# Patient Record
Sex: Female | Born: 1960 | Race: White | Hispanic: No | Marital: Married | Smoking: Never smoker
Health system: Southern US, Community
[De-identification: ages and names within clinical notes are randomized; demographics above are authoritative.]

## PROBLEM LIST (undated history)

## (undated) DIAGNOSIS — T7840XA Allergy, unspecified, initial encounter: Secondary | ICD-10-CM

## (undated) HISTORY — PX: BREAST EXCISIONAL BIOPSY: SUR124

## (undated) HISTORY — PX: COLONOSCOPY: SHX174

## (undated) HISTORY — PX: BREAST BIOPSY: SHX20

## (undated) HISTORY — DX: Allergy, unspecified, initial encounter: T78.40XA

---

## 2004-11-05 ENCOUNTER — Encounter: Admission: RE | Admit: 2004-11-05 | Discharge: 2004-11-05 | Payer: Self-pay | Admitting: Obstetrics and Gynecology

## 2005-11-07 ENCOUNTER — Encounter: Admission: RE | Admit: 2005-11-07 | Discharge: 2005-11-07 | Payer: Self-pay | Admitting: Obstetrics and Gynecology

## 2006-01-23 ENCOUNTER — Encounter (INDEPENDENT_AMBULATORY_CARE_PROVIDER_SITE_OTHER): Payer: Self-pay | Admitting: Specialist

## 2006-01-23 ENCOUNTER — Ambulatory Visit (HOSPITAL_COMMUNITY): Admission: RE | Admit: 2006-01-23 | Discharge: 2006-01-23 | Payer: Self-pay | Admitting: Gastroenterology

## 2006-11-09 ENCOUNTER — Encounter: Admission: RE | Admit: 2006-11-09 | Discharge: 2006-11-09 | Payer: Self-pay | Admitting: Obstetrics and Gynecology

## 2007-09-26 ENCOUNTER — Emergency Department (HOSPITAL_COMMUNITY): Admission: EM | Admit: 2007-09-26 | Discharge: 2007-09-26 | Payer: Self-pay | Admitting: Emergency Medicine

## 2007-11-12 ENCOUNTER — Encounter: Admission: RE | Admit: 2007-11-12 | Discharge: 2007-11-12 | Payer: Self-pay | Admitting: Obstetrics and Gynecology

## 2008-11-13 ENCOUNTER — Encounter: Admission: RE | Admit: 2008-11-13 | Discharge: 2008-11-13 | Payer: Self-pay | Admitting: Obstetrics and Gynecology

## 2009-11-16 ENCOUNTER — Encounter: Admission: RE | Admit: 2009-11-16 | Discharge: 2009-11-16 | Payer: Self-pay | Admitting: Obstetrics and Gynecology

## 2010-03-22 ENCOUNTER — Encounter: Payer: Self-pay | Admitting: Obstetrics and Gynecology

## 2010-07-16 NOTE — Op Note (Signed)
Ana, [name] NO.:  [id number]   MEDICAL RECORD NO.:  [id number]          PATIENT TYPE:  AMB   LOCATION:  ENDO                         FACILITY:  [facility]   PHYSICIAN:  Anselmo Rod, M.D.  DATE OF BIRTH:  [birth year]   DATE OF PROCEDURE:  01/23/2006  DATE OF DISCHARGE:                               OPERATIVE REPORT   PROCEDURE PERFORMED:  Esophagogastroduodenoscopy with antral biopsies.   ENDOSCOPIST:  Anselmo Rod, M.D.   INSTRUMENT USED:  Olympus video panendoscope.   INDICATIONS FOR PROCEDURE:  50 year old white female with a history of  chest pain and pressure undergoing EGD to rule out esophagitis,  gastritis, etc.   PREPROCEDURE PREPARATION:  Informed consent was procured from the  patient.  The patient was fasted for 8 hours prior to procedure. Risks  and benefits were discussed within great detail.   PREPROCEDURE PHYSICAL:  Patient had stable vital signs.  NECK:  Supple.  CHEST:  Clear to auscultation.  S1 and S2 regular.  ABDOMEN:  Soft with  normal bowel sounds.   DESCRIPTION OF PROCEDURE:  The patient was placed in left lateral  decubitus position and sedated with 75 mcg of fentanyl and 8 mg of  Versed given intravenously in slow incremental doses.  Once the patient  was adequately sedated and maintained on low-flow oxygen and continuous  cardiac monitoring the Olympus video panendoscope was advanced through  the mouthpiece over the tongue into the esophagus under direct vision.  The proximal esophagus appeared normal, grade 2 distal esophagitis was  noted. On advancing the scope into the stomach diffuse gastritis was  noted.  Antral biopsies were done to rule out presence of H pylori by  pathology.  Retroflexion in the high cardia revealed no abnormalities,  the proximal small bowel appeared normal.  There was no outlet  obstruction. The patient tolerated the procedure well without immediate  complications.   IMPRESSION:  1. Grade  2 distal esophagitis.  2. Diffuse gastritis, biopsies done for H pylori.  3. Normal proximal small bowel.   RECOMMENDATIONS:  1. Continue PPI.  2. Avoid nonsteroidals.  3. Carafate slurry 1 gram q.i.d. between meals and bedtime.  4. Treat with antibiotics if H pylori present on biopsies.  5. Outpatient follow-up in the next 2 weeks for further      recommendations.      Anselmo Rod, M.D.  Electronically Signed     JNM/MEDQ  D:  01/23/2006  T:  01/23/2006  Job:  [id number]   cc:   Talmadge Coventry, M.D.  Sherry A. Rosalio Macadamia, M.D.

## 2010-10-13 ENCOUNTER — Other Ambulatory Visit: Payer: Self-pay | Admitting: Obstetrics

## 2010-10-13 DIAGNOSIS — Z1231 Encounter for screening mammogram for malignant neoplasm of breast: Secondary | ICD-10-CM

## 2010-11-22 ENCOUNTER — Ambulatory Visit
Admission: RE | Admit: 2010-11-22 | Discharge: 2010-11-22 | Disposition: A | Discharge status: Home / Self Care | Payer: Managed Care, Other (non HMO) | Source: Ambulatory Visit | Attending: Obstetrics | Admitting: Obstetrics

## 2010-11-22 DIAGNOSIS — Z1231 Encounter for screening mammogram for malignant neoplasm of breast: Secondary | ICD-10-CM

## 2011-03-30 ENCOUNTER — Other Ambulatory Visit: Payer: Self-pay | Admitting: Obstetrics

## 2011-03-30 DIAGNOSIS — Z780 Asymptomatic menopausal state: Secondary | ICD-10-CM

## 2011-03-30 DIAGNOSIS — M8580 Other specified disorders of bone density and structure, unspecified site: Secondary | ICD-10-CM

## 2011-04-04 ENCOUNTER — Ambulatory Visit
Admission: RE | Admit: 2011-04-04 | Discharge: 2011-04-04 | Disposition: A | Discharge status: Home / Self Care | Payer: Managed Care, Other (non HMO) | Source: Ambulatory Visit | Attending: Obstetrics | Admitting: Obstetrics

## 2011-04-04 DIAGNOSIS — M8580 Other specified disorders of bone density and structure, unspecified site: Secondary | ICD-10-CM

## 2011-04-04 DIAGNOSIS — Z780 Asymptomatic menopausal state: Secondary | ICD-10-CM

## 2011-10-26 ENCOUNTER — Other Ambulatory Visit: Payer: Self-pay | Admitting: Obstetrics

## 2011-10-26 DIAGNOSIS — Z1231 Encounter for screening mammogram for malignant neoplasm of breast: Secondary | ICD-10-CM

## 2011-11-25 ENCOUNTER — Ambulatory Visit
Admission: RE | Admit: 2011-11-25 | Discharge: 2011-11-25 | Disposition: A | Discharge status: Home / Self Care | Payer: Managed Care, Other (non HMO) | Source: Ambulatory Visit | Attending: Obstetrics | Admitting: Obstetrics

## 2011-11-25 DIAGNOSIS — Z1231 Encounter for screening mammogram for malignant neoplasm of breast: Secondary | ICD-10-CM

## 2012-10-25 ENCOUNTER — Other Ambulatory Visit: Payer: Self-pay

## 2012-10-25 DIAGNOSIS — R922 Inconclusive mammogram: Secondary | ICD-10-CM

## 2012-10-25 DIAGNOSIS — R9230 Dense breasts, unspecified: Secondary | ICD-10-CM

## 2012-10-25 DIAGNOSIS — Z9889 Other specified postprocedural states: Secondary | ICD-10-CM

## 2012-10-25 DIAGNOSIS — Z1231 Encounter for screening mammogram for malignant neoplasm of breast: Secondary | ICD-10-CM

## 2012-10-26 ENCOUNTER — Other Ambulatory Visit: Payer: Self-pay | Admitting: Obstetrics

## 2012-10-26 DIAGNOSIS — R9230 Dense breasts, unspecified: Secondary | ICD-10-CM

## 2012-10-26 DIAGNOSIS — Z1231 Encounter for screening mammogram for malignant neoplasm of breast: Secondary | ICD-10-CM

## 2012-10-26 DIAGNOSIS — Z9889 Other specified postprocedural states: Secondary | ICD-10-CM

## 2012-10-26 DIAGNOSIS — R922 Inconclusive mammogram: Secondary | ICD-10-CM

## 2012-11-12 ENCOUNTER — Ambulatory Visit
Admission: RE | Admit: 2012-11-12 | Discharge: 2012-11-12 | Disposition: A | Discharge status: Home / Self Care | Payer: BC Managed Care – PPO | Source: Ambulatory Visit

## 2012-11-12 DIAGNOSIS — R9230 Dense breasts, unspecified: Secondary | ICD-10-CM

## 2012-11-12 DIAGNOSIS — Z1231 Encounter for screening mammogram for malignant neoplasm of breast: Secondary | ICD-10-CM

## 2012-11-12 DIAGNOSIS — R922 Inconclusive mammogram: Secondary | ICD-10-CM

## 2012-11-12 DIAGNOSIS — Z9889 Other specified postprocedural states: Secondary | ICD-10-CM

## 2013-10-08 ENCOUNTER — Other Ambulatory Visit: Payer: Self-pay

## 2013-10-08 DIAGNOSIS — Z1231 Encounter for screening mammogram for malignant neoplasm of breast: Secondary | ICD-10-CM

## 2013-11-13 ENCOUNTER — Ambulatory Visit
Admission: RE | Admit: 2013-11-13 | Discharge: 2013-11-13 | Disposition: A | Discharge status: Home / Self Care | Payer: BC Managed Care – PPO | Source: Ambulatory Visit

## 2013-11-13 DIAGNOSIS — Z1231 Encounter for screening mammogram for malignant neoplasm of breast: Secondary | ICD-10-CM

## 2014-09-29 ENCOUNTER — Other Ambulatory Visit: Payer: Self-pay

## 2014-09-29 DIAGNOSIS — Z1231 Encounter for screening mammogram for malignant neoplasm of breast: Secondary | ICD-10-CM

## 2014-11-17 ENCOUNTER — Ambulatory Visit
Admission: RE | Admit: 2014-11-17 | Discharge: 2014-11-17 | Disposition: A | Discharge status: Home / Self Care | Payer: BLUE CROSS/BLUE SHIELD | Source: Ambulatory Visit

## 2014-11-17 DIAGNOSIS — Z1231 Encounter for screening mammogram for malignant neoplasm of breast: Secondary | ICD-10-CM

## 2015-06-24 ENCOUNTER — Other Ambulatory Visit: Payer: Self-pay | Admitting: Obstetrics

## 2015-06-24 DIAGNOSIS — M8580 Other specified disorders of bone density and structure, unspecified site: Secondary | ICD-10-CM

## 2015-07-31 ENCOUNTER — Ambulatory Visit
Admission: RE | Admit: 2015-07-31 | Discharge: 2015-07-31 | Disposition: A | Discharge status: Home / Self Care | Payer: BLUE CROSS/BLUE SHIELD | Source: Ambulatory Visit | Attending: Obstetrics | Admitting: Obstetrics

## 2015-07-31 DIAGNOSIS — M8580 Other specified disorders of bone density and structure, unspecified site: Secondary | ICD-10-CM

## 2015-10-09 ENCOUNTER — Other Ambulatory Visit: Payer: Self-pay | Admitting: Obstetrics

## 2015-10-09 DIAGNOSIS — Z1231 Encounter for screening mammogram for malignant neoplasm of breast: Secondary | ICD-10-CM

## 2015-11-19 ENCOUNTER — Ambulatory Visit
Admission: RE | Admit: 2015-11-19 | Discharge: 2015-11-19 | Disposition: A | Discharge status: Home / Self Care | Payer: BLUE CROSS/BLUE SHIELD | Source: Ambulatory Visit | Attending: Obstetrics | Admitting: Obstetrics

## 2015-11-19 DIAGNOSIS — Z1231 Encounter for screening mammogram for malignant neoplasm of breast: Secondary | ICD-10-CM

## 2016-03-28 DIAGNOSIS — Z0000 Encounter for general adult medical examination without abnormal findings: Secondary | ICD-10-CM | POA: Diagnosis not present

## 2016-03-28 DIAGNOSIS — Z23 Encounter for immunization: Secondary | ICD-10-CM | POA: Diagnosis not present

## 2016-03-28 DIAGNOSIS — Z6820 Body mass index (BMI) 20.0-20.9, adult: Secondary | ICD-10-CM | POA: Diagnosis not present

## 2016-03-28 DIAGNOSIS — Z136 Encounter for screening for cardiovascular disorders: Secondary | ICD-10-CM | POA: Diagnosis not present

## 2016-05-26 DIAGNOSIS — Z23 Encounter for immunization: Secondary | ICD-10-CM | POA: Diagnosis not present

## 2016-06-27 DIAGNOSIS — Z01419 Encounter for gynecological examination (general) (routine) without abnormal findings: Secondary | ICD-10-CM | POA: Diagnosis not present

## 2016-06-27 DIAGNOSIS — Z681 Body mass index (BMI) 19 or less, adult: Secondary | ICD-10-CM | POA: Diagnosis not present

## 2016-09-26 DIAGNOSIS — Z23 Encounter for immunization: Secondary | ICD-10-CM | POA: Diagnosis not present

## 2016-10-11 ENCOUNTER — Other Ambulatory Visit: Payer: Self-pay | Admitting: Family Medicine

## 2016-10-11 DIAGNOSIS — Z1231 Encounter for screening mammogram for malignant neoplasm of breast: Secondary | ICD-10-CM

## 2016-11-21 ENCOUNTER — Ambulatory Visit
Admission: RE | Admit: 2016-11-21 | Discharge: 2016-11-21 | Disposition: A | Discharge status: Home / Self Care | Payer: Federal, State, Local not specified - PPO | Source: Ambulatory Visit | Attending: Family Medicine | Admitting: Family Medicine

## 2016-11-21 DIAGNOSIS — Z1231 Encounter for screening mammogram for malignant neoplasm of breast: Secondary | ICD-10-CM

## 2017-03-29 DIAGNOSIS — Z1211 Encounter for screening for malignant neoplasm of colon: Secondary | ICD-10-CM | POA: Diagnosis not present

## 2017-03-29 DIAGNOSIS — Z13220 Encounter for screening for lipoid disorders: Secondary | ICD-10-CM | POA: Diagnosis not present

## 2017-03-29 DIAGNOSIS — Z0000 Encounter for general adult medical examination without abnormal findings: Secondary | ICD-10-CM | POA: Diagnosis not present

## 2017-03-29 DIAGNOSIS — Z114 Encounter for screening for human immunodeficiency virus [HIV]: Secondary | ICD-10-CM | POA: Diagnosis not present

## 2017-03-29 DIAGNOSIS — Z1329 Encounter for screening for other suspected endocrine disorder: Secondary | ICD-10-CM | POA: Diagnosis not present

## 2017-03-29 DIAGNOSIS — Z6820 Body mass index (BMI) 20.0-20.9, adult: Secondary | ICD-10-CM | POA: Diagnosis not present

## 2017-07-11 ENCOUNTER — Other Ambulatory Visit: Payer: Self-pay | Admitting: Obstetrics

## 2017-07-11 DIAGNOSIS — M8580 Other specified disorders of bone density and structure, unspecified site: Secondary | ICD-10-CM

## 2017-07-11 DIAGNOSIS — Z9189 Other specified personal risk factors, not elsewhere classified: Secondary | ICD-10-CM | POA: Diagnosis not present

## 2017-07-11 DIAGNOSIS — Z139 Encounter for screening, unspecified: Secondary | ICD-10-CM

## 2017-07-11 DIAGNOSIS — Z681 Body mass index (BMI) 19 or less, adult: Secondary | ICD-10-CM | POA: Diagnosis not present

## 2017-07-11 DIAGNOSIS — Z01419 Encounter for gynecological examination (general) (routine) without abnormal findings: Secondary | ICD-10-CM | POA: Diagnosis not present

## 2017-11-22 ENCOUNTER — Ambulatory Visit
Admission: RE | Admit: 2017-11-22 | Discharge: 2017-11-22 | Disposition: A | Discharge status: Home / Self Care | Payer: Federal, State, Local not specified - PPO | Source: Ambulatory Visit | Attending: Obstetrics | Admitting: Obstetrics

## 2017-11-22 DIAGNOSIS — Z139 Encounter for screening, unspecified: Secondary | ICD-10-CM

## 2017-11-22 DIAGNOSIS — Z1231 Encounter for screening mammogram for malignant neoplasm of breast: Secondary | ICD-10-CM | POA: Diagnosis not present

## 2017-11-22 DIAGNOSIS — M8580 Other specified disorders of bone density and structure, unspecified site: Secondary | ICD-10-CM

## 2017-11-22 DIAGNOSIS — Z780 Asymptomatic menopausal state: Secondary | ICD-10-CM | POA: Diagnosis not present

## 2017-11-22 DIAGNOSIS — M8589 Other specified disorders of bone density and structure, multiple sites: Secondary | ICD-10-CM | POA: Diagnosis not present

## 2018-01-04 DIAGNOSIS — M859 Disorder of bone density and structure, unspecified: Secondary | ICD-10-CM | POA: Diagnosis not present

## 2018-04-03 DIAGNOSIS — Z23 Encounter for immunization: Secondary | ICD-10-CM | POA: Diagnosis not present

## 2018-04-03 DIAGNOSIS — Z1211 Encounter for screening for malignant neoplasm of colon: Secondary | ICD-10-CM | POA: Diagnosis not present

## 2018-04-03 DIAGNOSIS — Z1329 Encounter for screening for other suspected endocrine disorder: Secondary | ICD-10-CM | POA: Diagnosis not present

## 2018-04-03 DIAGNOSIS — Z13220 Encounter for screening for lipoid disorders: Secondary | ICD-10-CM | POA: Diagnosis not present

## 2018-04-03 DIAGNOSIS — Z114 Encounter for screening for human immunodeficiency virus [HIV]: Secondary | ICD-10-CM | POA: Diagnosis not present

## 2018-04-03 DIAGNOSIS — Z681 Body mass index (BMI) 19 or less, adult: Secondary | ICD-10-CM | POA: Diagnosis not present

## 2018-04-03 DIAGNOSIS — Z0000 Encounter for general adult medical examination without abnormal findings: Secondary | ICD-10-CM | POA: Diagnosis not present

## 2018-04-03 DIAGNOSIS — E559 Vitamin D deficiency, unspecified: Secondary | ICD-10-CM | POA: Diagnosis not present

## 2018-07-26 DIAGNOSIS — E559 Vitamin D deficiency, unspecified: Secondary | ICD-10-CM | POA: Diagnosis not present

## 2018-08-06 DIAGNOSIS — E559 Vitamin D deficiency, unspecified: Secondary | ICD-10-CM | POA: Diagnosis not present

## 2018-08-06 DIAGNOSIS — M810 Age-related osteoporosis without current pathological fracture: Secondary | ICD-10-CM | POA: Diagnosis not present

## 2018-08-06 DIAGNOSIS — Z719 Counseling, unspecified: Secondary | ICD-10-CM | POA: Diagnosis not present

## 2018-08-06 DIAGNOSIS — M8580 Other specified disorders of bone density and structure, unspecified site: Secondary | ICD-10-CM | POA: Diagnosis not present

## 2018-08-08 DIAGNOSIS — Z23 Encounter for immunization: Secondary | ICD-10-CM | POA: Diagnosis not present

## 2018-08-12 DIAGNOSIS — Z03818 Encounter for observation for suspected exposure to other biological agents ruled out: Secondary | ICD-10-CM | POA: Diagnosis not present

## 2018-10-12 ENCOUNTER — Other Ambulatory Visit: Payer: Self-pay | Admitting: Obstetrics

## 2018-10-12 DIAGNOSIS — Z1231 Encounter for screening mammogram for malignant neoplasm of breast: Secondary | ICD-10-CM

## 2018-11-13 DIAGNOSIS — Z23 Encounter for immunization: Secondary | ICD-10-CM | POA: Diagnosis not present

## 2018-12-03 ENCOUNTER — Other Ambulatory Visit: Payer: Self-pay

## 2018-12-03 ENCOUNTER — Ambulatory Visit
Admission: RE | Admit: 2018-12-03 | Discharge: 2018-12-03 | Disposition: A | Discharge status: Home / Self Care | Payer: Federal, State, Local not specified - PPO | Source: Ambulatory Visit | Attending: Obstetrics | Admitting: Obstetrics

## 2018-12-03 DIAGNOSIS — Z1231 Encounter for screening mammogram for malignant neoplasm of breast: Secondary | ICD-10-CM | POA: Diagnosis not present

## 2018-12-06 DIAGNOSIS — Z1151 Encounter for screening for human papillomavirus (HPV): Secondary | ICD-10-CM | POA: Diagnosis not present

## 2018-12-06 DIAGNOSIS — Z681 Body mass index (BMI) 19 or less, adult: Secondary | ICD-10-CM | POA: Diagnosis not present

## 2018-12-06 DIAGNOSIS — Z9189 Other specified personal risk factors, not elsewhere classified: Secondary | ICD-10-CM | POA: Diagnosis not present

## 2018-12-06 DIAGNOSIS — Z01419 Encounter for gynecological examination (general) (routine) without abnormal findings: Secondary | ICD-10-CM | POA: Diagnosis not present

## 2019-01-30 DIAGNOSIS — E559 Vitamin D deficiency, unspecified: Secondary | ICD-10-CM | POA: Diagnosis not present

## 2019-02-04 DIAGNOSIS — M810 Age-related osteoporosis without current pathological fracture: Secondary | ICD-10-CM | POA: Diagnosis not present

## 2019-02-04 DIAGNOSIS — E559 Vitamin D deficiency, unspecified: Secondary | ICD-10-CM | POA: Diagnosis not present

## 2019-02-04 DIAGNOSIS — M8580 Other specified disorders of bone density and structure, unspecified site: Secondary | ICD-10-CM | POA: Diagnosis not present

## 2019-04-05 DIAGNOSIS — Z0184 Encounter for antibody response examination: Secondary | ICD-10-CM | POA: Diagnosis not present

## 2019-04-05 DIAGNOSIS — Z0000 Encounter for general adult medical examination without abnormal findings: Secondary | ICD-10-CM | POA: Diagnosis not present

## 2019-04-05 DIAGNOSIS — E782 Mixed hyperlipidemia: Secondary | ICD-10-CM | POA: Diagnosis not present

## 2019-04-09 DIAGNOSIS — Z681 Body mass index (BMI) 19 or less, adult: Secondary | ICD-10-CM | POA: Diagnosis not present

## 2019-04-09 DIAGNOSIS — Z1211 Encounter for screening for malignant neoplasm of colon: Secondary | ICD-10-CM | POA: Diagnosis not present

## 2019-04-09 DIAGNOSIS — L309 Dermatitis, unspecified: Secondary | ICD-10-CM | POA: Diagnosis not present

## 2019-04-09 DIAGNOSIS — Z0000 Encounter for general adult medical examination without abnormal findings: Secondary | ICD-10-CM | POA: Diagnosis not present

## 2019-04-09 DIAGNOSIS — Z23 Encounter for immunization: Secondary | ICD-10-CM | POA: Diagnosis not present

## 2019-04-25 DIAGNOSIS — H6121 Impacted cerumen, right ear: Secondary | ICD-10-CM | POA: Diagnosis not present

## 2019-09-30 DIAGNOSIS — E559 Vitamin D deficiency, unspecified: Secondary | ICD-10-CM | POA: Diagnosis not present

## 2019-10-15 DIAGNOSIS — G4700 Insomnia, unspecified: Secondary | ICD-10-CM | POA: Diagnosis not present

## 2019-10-15 DIAGNOSIS — E559 Vitamin D deficiency, unspecified: Secondary | ICD-10-CM | POA: Diagnosis not present

## 2019-10-15 DIAGNOSIS — F411 Generalized anxiety disorder: Secondary | ICD-10-CM | POA: Diagnosis not present

## 2019-10-15 DIAGNOSIS — H9190 Unspecified hearing loss, unspecified ear: Secondary | ICD-10-CM | POA: Diagnosis not present

## 2019-10-16 DIAGNOSIS — H903 Sensorineural hearing loss, bilateral: Secondary | ICD-10-CM | POA: Diagnosis not present

## 2019-10-25 ENCOUNTER — Other Ambulatory Visit: Payer: Self-pay | Admitting: Obstetrics

## 2019-10-25 DIAGNOSIS — Z1231 Encounter for screening mammogram for malignant neoplasm of breast: Secondary | ICD-10-CM

## 2019-10-30 DIAGNOSIS — R21 Rash and other nonspecific skin eruption: Secondary | ICD-10-CM | POA: Diagnosis not present

## 2019-11-01 DIAGNOSIS — H903 Sensorineural hearing loss, bilateral: Secondary | ICD-10-CM | POA: Diagnosis not present

## 2019-11-08 DIAGNOSIS — Z23 Encounter for immunization: Secondary | ICD-10-CM | POA: Diagnosis not present

## 2019-12-16 ENCOUNTER — Ambulatory Visit
Admission: RE | Admit: 2019-12-16 | Discharge: 2019-12-16 | Disposition: A | Discharge status: Home / Self Care | Payer: Federal, State, Local not specified - PPO | Source: Ambulatory Visit | Attending: Obstetrics | Admitting: Obstetrics

## 2019-12-16 ENCOUNTER — Other Ambulatory Visit: Payer: Self-pay

## 2019-12-16 DIAGNOSIS — Z1231 Encounter for screening mammogram for malignant neoplasm of breast: Secondary | ICD-10-CM | POA: Diagnosis not present

## 2020-04-03 DIAGNOSIS — Z0000 Encounter for general adult medical examination without abnormal findings: Secondary | ICD-10-CM | POA: Diagnosis not present

## 2020-04-09 DIAGNOSIS — Z0000 Encounter for general adult medical examination without abnormal findings: Secondary | ICD-10-CM | POA: Diagnosis not present

## 2020-04-09 DIAGNOSIS — E559 Vitamin D deficiency, unspecified: Secondary | ICD-10-CM | POA: Diagnosis not present

## 2020-04-09 DIAGNOSIS — Z23 Encounter for immunization: Secondary | ICD-10-CM | POA: Diagnosis not present

## 2020-04-09 DIAGNOSIS — Z1211 Encounter for screening for malignant neoplasm of colon: Secondary | ICD-10-CM | POA: Diagnosis not present

## 2020-04-09 DIAGNOSIS — L309 Dermatitis, unspecified: Secondary | ICD-10-CM | POA: Diagnosis not present

## 2020-04-09 DIAGNOSIS — M8580 Other specified disorders of bone density and structure, unspecified site: Secondary | ICD-10-CM | POA: Diagnosis not present

## 2020-04-09 DIAGNOSIS — H6121 Impacted cerumen, right ear: Secondary | ICD-10-CM | POA: Diagnosis not present

## 2020-04-09 DIAGNOSIS — H6122 Impacted cerumen, left ear: Secondary | ICD-10-CM | POA: Diagnosis not present

## 2020-04-09 DIAGNOSIS — H6123 Impacted cerumen, bilateral: Secondary | ICD-10-CM | POA: Diagnosis not present

## 2020-05-08 ENCOUNTER — Telehealth: Payer: Self-pay

## 2020-05-08 NOTE — Telephone Encounter (Signed)
Recv'd records from [facility] forwarded 6 pages to [facility] 3/11/22fbg

## 2020-05-12 ENCOUNTER — Telehealth: Payer: Self-pay | Admitting: Gastroenterology

## 2020-05-12 NOTE — Telephone Encounter (Signed)
Good morning Dr. Lavon Paganini, we received a referral for patient to have a colonoscopy.  Patient had a colon in 2017.  Will be sending referral and colon records to you.  Can you please review and advise on scheduling?  Thank you.

## 2020-05-12 NOTE — Telephone Encounter (Signed)
Ok to schedule direct colonoscopy. Dr Bing [facility] recommeneded repeat colonoscopy in 5 years due to h/o advanced colon polyps in family.

## 2020-05-13 NOTE — Telephone Encounter (Signed)
Left message for patient to call back to schedule procedure.

## 2020-05-20 DIAGNOSIS — G4700 Insomnia, unspecified: Secondary | ICD-10-CM | POA: Diagnosis not present

## 2020-08-06 ENCOUNTER — Other Ambulatory Visit: Payer: Self-pay

## 2020-08-06 ENCOUNTER — Ambulatory Visit (AMBULATORY_SURGERY_CENTER): Payer: Federal, State, Local not specified - PPO | Admitting: *Deleted

## 2020-08-06 VITALS — Ht 64.0 in | Wt 110.0 lb

## 2020-08-06 DIAGNOSIS — Z8371 Family history of colonic polyps: Secondary | ICD-10-CM

## 2020-08-06 MED ORDER — SUTAB 1479-225-188 MG PO TABS: 24.0000 | ORAL_TABLET | ORAL | 0 refills | Status: DC

## 2020-08-06 NOTE — Progress Notes (Signed)
Pt verified name, DOB, address and insurance during PV today. Pt mailed instruction packet to included paper to complete and mail back to [facility] with addressed and stamped envelope, Emmi video, copy of consent form to read and not return, and instructions. [employer]  coupon mailed in packet. PV completed over the phone. Pt encouraged to call with questions or issues. My Chart instructions to pt as well    No egg or soy allergy known to patient  No issues with past sedation with any surgeries or procedures Patient denies ever being told they had issues or difficulty with intubation  No FH of Malignant Hyperthermia No diet pills per patient No home 02 use per patient  No blood thinners per patient  Pt denies issues with constipation  No A fib or A flutter  EMMI video to pt or via MyChart  COVID 19 guidelines implemented in PV today with Pt and RN  Pt is fully vaccinated  for Covid   [employer]  Coupon given to pt in PV today , Code to Pharmacy and  NO PA's for preps discussed with pt In PV today  Discussed with pt there will be an out-of-pocket cost for prep and that varies from $0 to 70 dollars   Due to the COVID-19 pandemic we are asking patients to follow certain guidelines.  Pt aware of COVID protocols and [facility] guidelines   

## 2020-08-13 ENCOUNTER — Telehealth: Payer: Self-pay | Admitting: Gastroenterology

## 2020-08-13 NOTE — Telephone Encounter (Signed)
Patient called states the Ana Rice is too expensive and is asking if we still have any samples in the office to pick up.

## 2020-08-13 NOTE — Telephone Encounter (Signed)
Called and spoke with pharmacist at patient's pharmacy- patient had turned in coupon and reported she did not want to pay the $40.00 for the prep; Called and spoke with patient =patient advised the prep would be an out of pocket cost to her at the [facility] appt and she was advised to use the coupon; patient reports she will get the prep from the pharmacy because she knew she needed to use the coupon for the discount on the prep;

## 2020-08-14 IMAGING — MG DIGITAL SCREENING BILATERAL MAMMOGRAM WITH TOMO AND CAD
8 series · 9 of 24 positions shown · non-contrast
Comparison: Previous exam(s).

CLINICAL DATA: Screening.

EXAM:
DIGITAL SCREENING BILATERAL MAMMOGRAM WITH TOMO AND CAD

[L CC synth-2D]
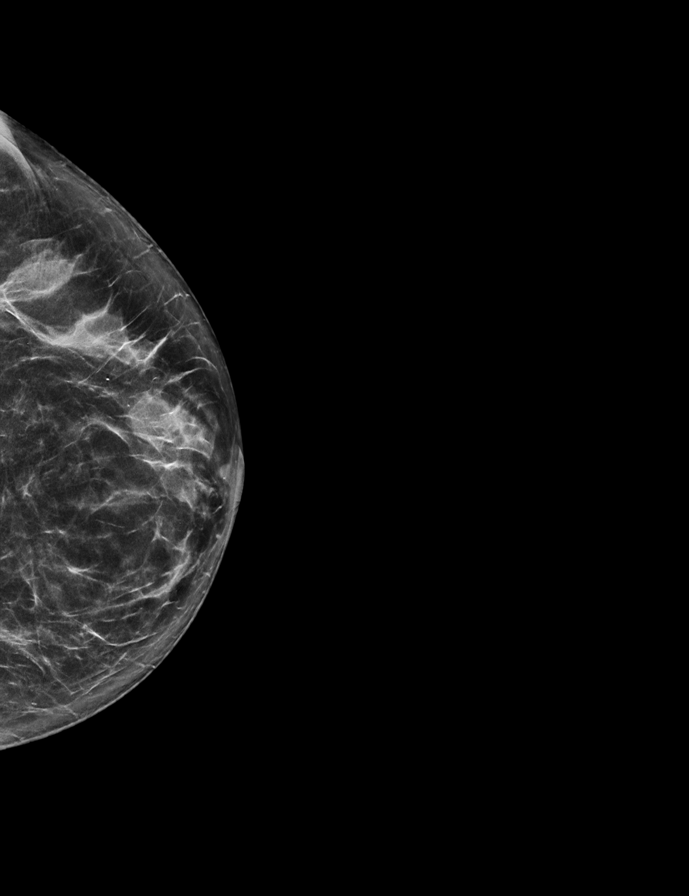

[L MLO synth-2D]
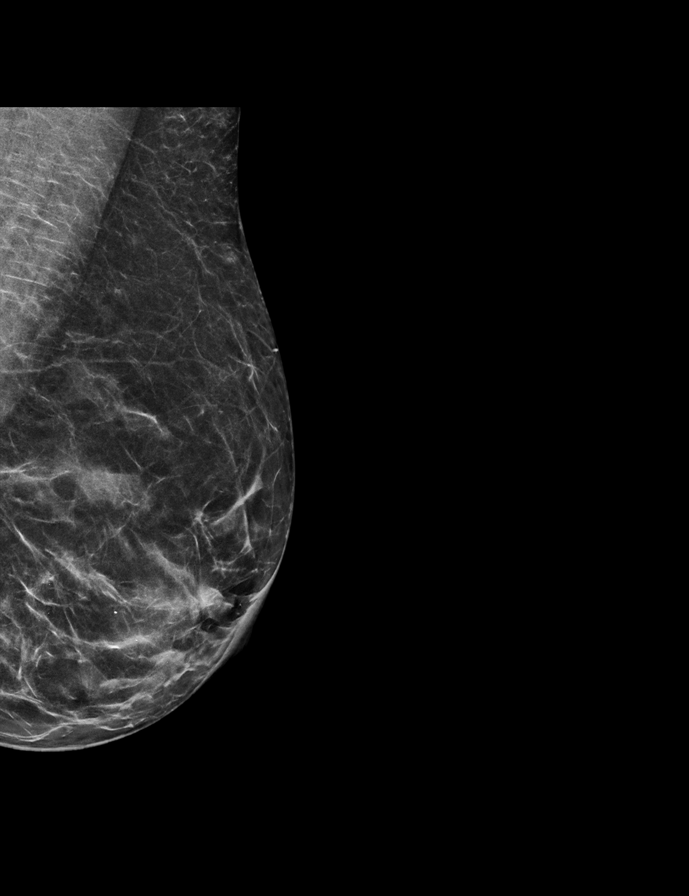

[R MLO synth-2D]
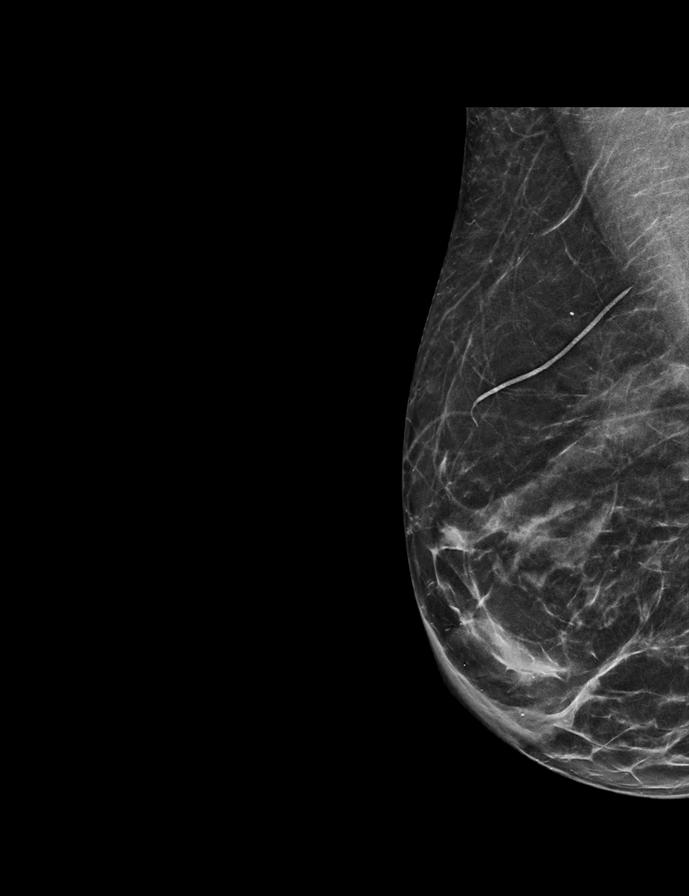

[R CC synth-2D]
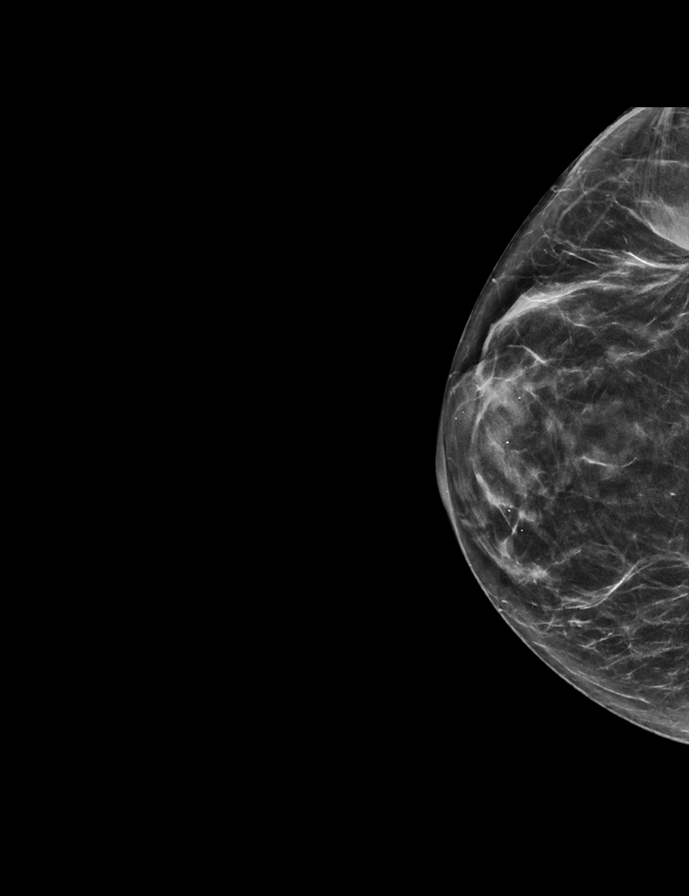

[L MLO tomo · 2 of 58 frames shown]
[frame 19/58]
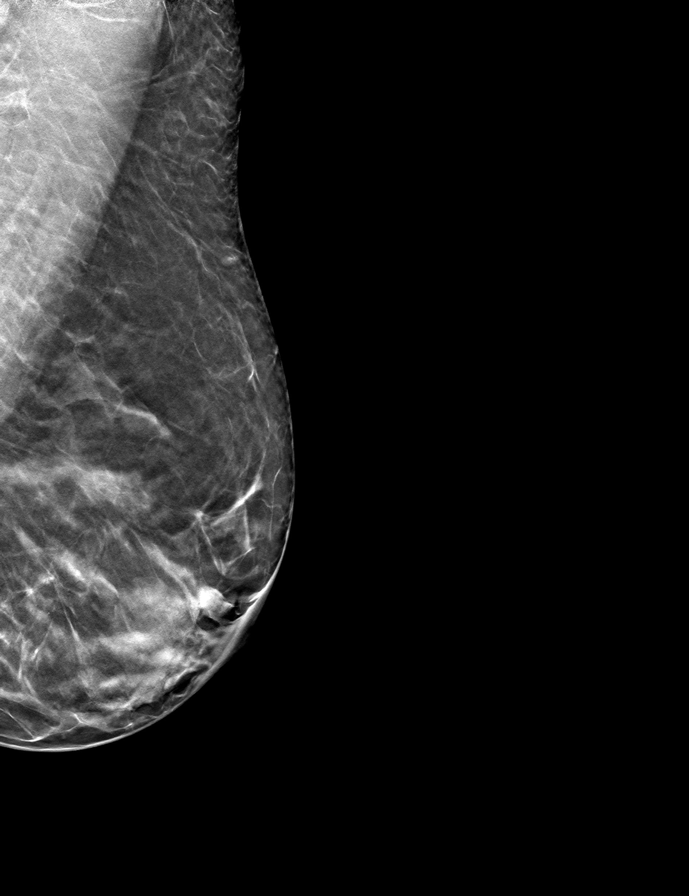
[frame 29/58]
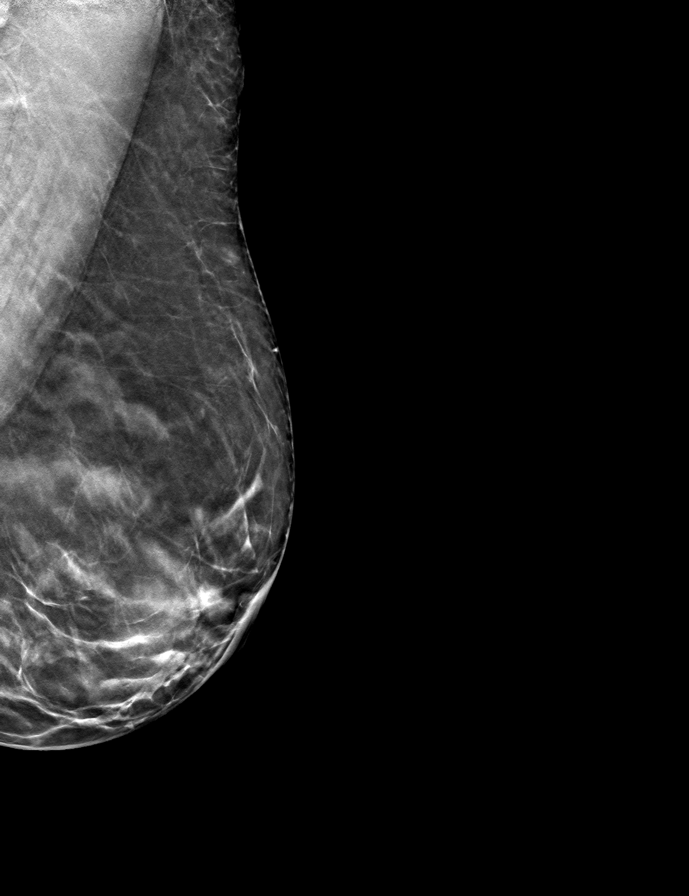

[L CC tomo · tomo slice 30/59.0]
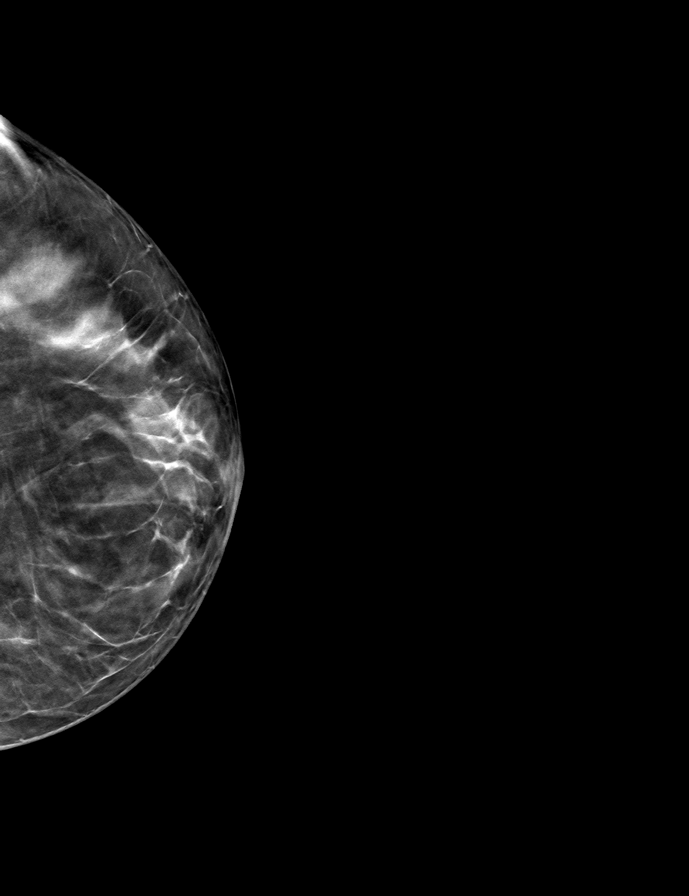

[R CC tomo · tomo slice 29/56.0]
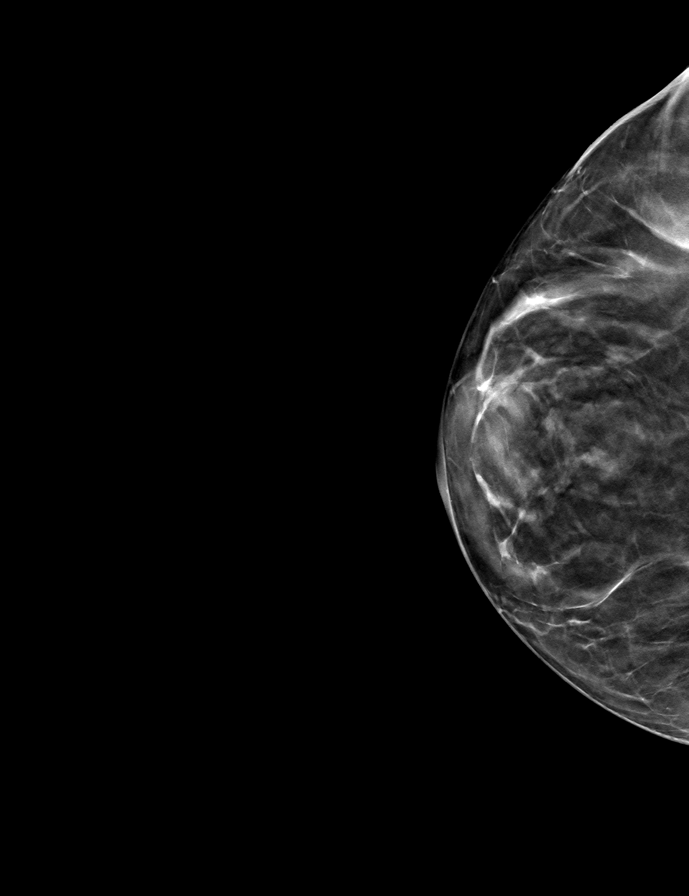

[R MLO tomo · tomo slice 27/53.0]
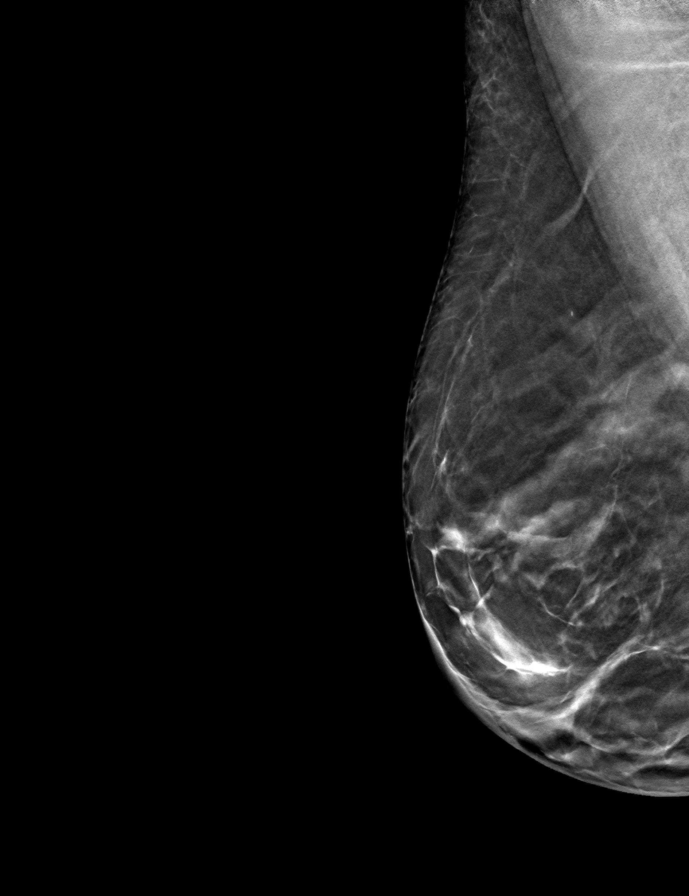

[9 of 24 positions shown; findings below may reference images not displayed]

ACR Breast Density Category c: The breast tissue is heterogeneously
dense, which may obscure small masses.
FINDINGS: There are no findings suspicious for malignancy. Images were
processed with CAD.
IMPRESSION: No mammographic evidence of malignancy. A result letter of this
screening mammogram will be mailed directly to the patient.

RECOMMENDATION:
Screening mammogram in one year. (Code:FT-U-LHB)

BI-RADS CATEGORY  1: Negative.

## 2020-08-18 ENCOUNTER — Encounter: Payer: Self-pay | Admitting: Certified Registered Nurse Anesthetist

## 2020-08-19 ENCOUNTER — Other Ambulatory Visit: Payer: Self-pay

## 2020-08-19 ENCOUNTER — Ambulatory Visit (AMBULATORY_SURGERY_CENTER): Payer: Federal, State, Local not specified - PPO | Admitting: Gastroenterology

## 2020-08-19 ENCOUNTER — Encounter: Payer: Self-pay | Admitting: Gastroenterology

## 2020-08-19 VITALS — BP 115/68 | HR 66 | Temp 98.9°F | Resp 8 | Ht 64.0 in | Wt 110.0 lb

## 2020-08-19 DIAGNOSIS — Z538 Procedure and treatment not carried out for other reasons: Secondary | ICD-10-CM | POA: Diagnosis not present

## 2020-08-19 DIAGNOSIS — Z86010 Personal history of colonic polyps: Secondary | ICD-10-CM | POA: Diagnosis not present

## 2020-08-19 DIAGNOSIS — Z8371 Family history of colonic polyps: Secondary | ICD-10-CM

## 2020-08-19 DIAGNOSIS — Z1211 Encounter for screening for malignant neoplasm of colon: Secondary | ICD-10-CM | POA: Diagnosis not present

## 2020-08-19 MED ORDER — SODIUM CHLORIDE 0.9 % IV SOLN: 500.0000 mL | INTRAVENOUS | Status: DC

## 2020-08-19 NOTE — Progress Notes (Signed)
Procedure stopped due poor prep.  Report given to PACU, vss

## 2020-08-19 NOTE — Progress Notes (Signed)
Patient will call back to reschedule repeat colonoscopy.

## 2020-08-19 NOTE — Progress Notes (Signed)
Vs by cw; no changes to health hx since televisit.

## 2020-08-19 NOTE — Op Note (Signed)
[facility] Patient Name: Ana Rice Procedure Date: 08/19/2020 9:25 AM MRN: [id number] Endoscopist: Napoleon Form , MD Age: 60 Referring MD:  Date of Birth: 1960/03/12 Gender: Female Account #: [id number] Procedure:                Colonoscopy Indications:              High risk colon cancer surveillance: Personal                            history of colonic polyps Medicines:                Monitored Anesthesia Care Procedure:                Pre-Anesthesia Assessment:                           - Prior to the procedure, a History and Physical                            was performed, and patient medications and                            allergies were reviewed. The patient's tolerance of                            previous anesthesia was also reviewed. The risks                            and benefits of the procedure and the sedation                            options and risks were discussed with the patient.                            All questions were answered, and informed consent                            was obtained. Prior Anticoagulants: The patient has                            taken no previous anticoagulant or antiplatelet                            agents. ASA Grade Assessment: I - A normal, healthy                            patient. After reviewing the risks and benefits,                            the patient was deemed in satisfactory condition to                            undergo the procedure.  After obtaining informed consent, the colonoscope                            was passed under direct vision. Throughout the                            procedure, the patient's blood pressure, pulse, and                            oxygen saturations were monitored continuously. The                            Olympus PCF-H190DL ([id number]) Colonoscope was                            introduced through the anus with the intention  of                            advancing to the cecum. The scope was advanced to                            the descending colon before the procedure was                            aborted. Medications were given. The colonoscopy                            was performed without difficulty. The patient                            tolerated the procedure well. The quality of the                            bowel preparation was poor. Scope In: 9:54:55 AM Scope Out: 9:57:28 AM Total Procedure Duration: 0 hours 2 minutes 33 seconds  Findings:                 The perianal and digital rectal examinations were                            normal.                           A moderate amount of semi solid/liquid stool with                            large amount of fiber/seeds was found in the                            descending colon, interfering with visualization,                            unable to suction without clogging the suction  channel. Complications:            No immediate complications. Estimated Blood Loss:     Estimated blood loss: none. Impression:               - Preparation of the colon was poor.                           - Stool in the descending colon.                           - No specimens collected. Recommendation:           - Patient has a contact number available for                            emergencies. The signs and symptoms of potential                            delayed complications were discussed with the                            patient. Return to normal activities tomorrow.                            Written discharge instructions were provided to the                            patient.                           - Resume previous diet.                           - Continue present medications.                           - Repeat colonoscopy at the next available                            appointment because the bowel preparation was                             suboptimal. Napoleon Form, MD 08/19/2020 10:08:53 AM This report has been signed electronically.

## 2020-08-19 NOTE — Patient Instructions (Signed)
Discharge instructions given. Patient will call back to reschedule repeat colonoscopy. Resume previous medications. YOU HAD AN ENDOSCOPIC PROCEDURE TODAY AT THE [facility]:   Refer to the procedure report that was given to you for any specific questions about what was found during the examination.  If the procedure report does not answer your questions, please call your gastroenterologist to clarify.  If you requested that your care partner not be given the details of your procedure findings, then the procedure report has been included in a sealed envelope for you to review at your convenience later.  YOU SHOULD EXPECT: Some feelings of bloating in the abdomen. Passage of more gas than usual.  Walking can help get rid of the air that was put into your GI tract during the procedure and reduce the bloating. If you had a lower endoscopy (such as a colonoscopy or flexible sigmoidoscopy) you may notice spotting of blood in your stool or on the toilet paper. If you underwent a bowel prep for your procedure, you may not have a normal bowel movement for a few days.  Please Note:  You might notice some irritation and congestion in your nose or some drainage.  This is from the oxygen used during your procedure.  There is no need for concern and it should clear up in a day or so.  SYMPTOMS TO REPORT IMMEDIATELY:  Following lower endoscopy (colonoscopy or flexible sigmoidoscopy):  Excessive amounts of blood in the stool  Significant tenderness or worsening of abdominal pains  Swelling of the abdomen that is new, acute  Fever of 100F or higher   For urgent or emergent issues, a gastroenterologist can be reached at any hour by calling [phone]. Do not use MyChart messaging for urgent concerns.    DIET:  We do recommend a small meal at first, but then you may proceed to your regular diet.  Drink plenty of fluids but you should avoid alcoholic beverages for 24 hours.  ACTIVITY:  You  should plan to take it easy for the rest of today and you should NOT DRIVE or use heavy machinery until tomorrow (because of the sedation medicines used during the test).    FOLLOW UP: Our staff will call the number listed on your records 48-72 hours following your procedure to check on you and address any questions or concerns that you may have regarding the information given to you following your procedure. If we do not reach you, we will leave a message.  We will attempt to reach you two times.  During this call, we will ask if you have developed any symptoms of COVID 19. If you develop any symptoms (ie: fever, flu-like symptoms, shortness of breath, cough etc.) before then, please call [phone].  If you test positive for Covid 19 in the 2 weeks post procedure, please call and report this information to [place].    If any biopsies were taken you will be contacted by phone or by letter within the next 1-3 weeks.  Please call us at [phone] if you have not heard about the biopsies in 3 weeks.    SIGNATURES/CONFIDENTIALITY: You and/or your care partner have signed paperwork which will be entered into your electronic medical record.  These signatures attest to the fact that that the information above on your After Visit Summary has been reviewed and is understood.  Full responsibility of the confidentiality of this discharge information lies with you and/or your care-partner.

## 2020-08-20 ENCOUNTER — Encounter: Payer: Self-pay | Admitting: Gastroenterology

## 2020-08-21 ENCOUNTER — Telehealth: Payer: Self-pay

## 2020-08-21 NOTE — Telephone Encounter (Signed)
  Follow up Call-  Call back number 08/19/2020  Post procedure Call Back phone  # [phone]  Permission to leave phone message Yes  Some recent data might be hidden     Patient questions:  Do you have a fever, pain , or abdominal swelling? No. Pain Score  0 *  Have you tolerated food without any problems? Yes.    Have you been able to return to your normal activities? Yes.    Do you have any questions about your discharge instructions: Diet   No. Medications  No. Follow up visit  No.  Do you have questions or concerns about your Care? No.  Actions: * If pain score is 4 or above: No action needed, pain <4.  Have you developed a fever since your procedure? No   2.   Have you had an respiratory symptoms (SOB or cough) since your procedure? No   3.   Have you tested positive for COVID 19 since your procedure no   4.   Have you had any family members/close contacts diagnosed with the COVID 19 since your procedure?  No    If yes to any of these questions please route to Laverna Peace, RN and Karlton Lemon, RN

## 2020-10-28 DIAGNOSIS — L309 Dermatitis, unspecified: Secondary | ICD-10-CM | POA: Diagnosis not present

## 2020-10-28 DIAGNOSIS — H6121 Impacted cerumen, right ear: Secondary | ICD-10-CM | POA: Diagnosis not present

## 2020-10-28 DIAGNOSIS — Z23 Encounter for immunization: Secondary | ICD-10-CM | POA: Diagnosis not present

## 2020-10-28 DIAGNOSIS — E559 Vitamin D deficiency, unspecified: Secondary | ICD-10-CM | POA: Diagnosis not present

## 2020-10-28 DIAGNOSIS — Z719 Counseling, unspecified: Secondary | ICD-10-CM | POA: Diagnosis not present

## 2020-10-28 DIAGNOSIS — H6122 Impacted cerumen, left ear: Secondary | ICD-10-CM | POA: Diagnosis not present

## 2020-10-28 DIAGNOSIS — H6123 Impacted cerumen, bilateral: Secondary | ICD-10-CM | POA: Diagnosis not present

## 2020-11-09 ENCOUNTER — Other Ambulatory Visit: Payer: Self-pay | Admitting: Obstetrics

## 2020-11-09 DIAGNOSIS — Z1231 Encounter for screening mammogram for malignant neoplasm of breast: Secondary | ICD-10-CM

## 2020-11-10 DIAGNOSIS — Z01419 Encounter for gynecological examination (general) (routine) without abnormal findings: Secondary | ICD-10-CM | POA: Diagnosis not present

## 2020-11-10 DIAGNOSIS — Z87898 Personal history of other specified conditions: Secondary | ICD-10-CM | POA: Diagnosis not present

## 2020-11-10 DIAGNOSIS — Z681 Body mass index (BMI) 19 or less, adult: Secondary | ICD-10-CM | POA: Diagnosis not present

## 2020-11-12 ENCOUNTER — Other Ambulatory Visit: Payer: Self-pay | Admitting: Obstetrics

## 2020-11-12 DIAGNOSIS — M8580 Other specified disorders of bone density and structure, unspecified site: Secondary | ICD-10-CM

## 2020-12-29 ENCOUNTER — Other Ambulatory Visit: Payer: Self-pay

## 2020-12-29 ENCOUNTER — Ambulatory Visit
Admission: RE | Admit: 2020-12-29 | Discharge: 2020-12-29 | Disposition: A | Discharge status: Home / Self Care | Payer: Federal, State, Local not specified - PPO | Source: Ambulatory Visit | Attending: Obstetrics | Admitting: Obstetrics

## 2020-12-29 DIAGNOSIS — Z1231 Encounter for screening mammogram for malignant neoplasm of breast: Secondary | ICD-10-CM | POA: Diagnosis not present

## 2021-03-17 DIAGNOSIS — H6503 Acute serous otitis media, bilateral: Secondary | ICD-10-CM | POA: Diagnosis not present

## 2021-03-26 DIAGNOSIS — L503 Dermatographic urticaria: Secondary | ICD-10-CM | POA: Diagnosis not present

## 2021-03-26 DIAGNOSIS — L308 Other specified dermatitis: Secondary | ICD-10-CM | POA: Diagnosis not present

## 2021-04-30 ENCOUNTER — Ambulatory Visit
Admission: RE | Admit: 2021-04-30 | Discharge: 2021-04-30 | Disposition: A | Discharge status: Home / Self Care | Payer: Federal, State, Local not specified - PPO | Source: Ambulatory Visit | Attending: Obstetrics | Admitting: Obstetrics

## 2021-04-30 DIAGNOSIS — M8580 Other specified disorders of bone density and structure, unspecified site: Secondary | ICD-10-CM

## 2021-04-30 DIAGNOSIS — M810 Age-related osteoporosis without current pathological fracture: Secondary | ICD-10-CM | POA: Diagnosis not present

## 2021-04-30 DIAGNOSIS — M85851 Other specified disorders of bone density and structure, right thigh: Secondary | ICD-10-CM | POA: Diagnosis not present

## 2021-04-30 DIAGNOSIS — Z780 Asymptomatic menopausal state: Secondary | ICD-10-CM | POA: Diagnosis not present

## 2021-05-07 DIAGNOSIS — M810 Age-related osteoporosis without current pathological fracture: Secondary | ICD-10-CM | POA: Diagnosis not present

## 2022-06-29 NOTE — Telephone Encounter (Signed)
error
